# Patient Record
Sex: Female | Born: 2000 | Race: Black or African American | Hispanic: No | Marital: Single
Health system: Southern US, Community
[De-identification: ages and names within clinical notes are randomized; demographics above are authoritative.]

## PROBLEM LIST (undated history)

## (undated) DIAGNOSIS — J302 Other seasonal allergic rhinitis: Secondary | ICD-10-CM

---
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
## 2022-09-21 ENCOUNTER — Emergency Department (HOSPITAL_COMMUNITY)
Admission: EM | Admit: 2022-09-21 | Discharge: 2022-09-22 | Disposition: A | Discharge status: Home / Self Care | Payer: 59 | Attending: Emergency Medicine | Admitting: Emergency Medicine

## 2022-09-21 ENCOUNTER — Encounter (HOSPITAL_COMMUNITY): Payer: Self-pay

## 2022-09-21 ENCOUNTER — Other Ambulatory Visit: Payer: Self-pay

## 2022-09-21 DIAGNOSIS — R7401 Elevation of levels of liver transaminase levels: Secondary | ICD-10-CM | POA: Diagnosis not present

## 2022-09-21 DIAGNOSIS — R748 Abnormal levels of other serum enzymes: Secondary | ICD-10-CM | POA: Diagnosis not present

## 2022-09-21 DIAGNOSIS — R112 Nausea with vomiting, unspecified: Secondary | ICD-10-CM | POA: Insufficient documentation

## 2022-09-21 DIAGNOSIS — R1011 Right upper quadrant pain: Secondary | ICD-10-CM | POA: Diagnosis not present

## 2022-09-21 LAB — LIPASE, BLOOD: Lipase: 26 U/L (ref 11–51)

## 2022-09-21 LAB — CBC
HCT: 38.9 % (ref 36.0–46.0)
Hemoglobin: 12.4 g/dL (ref 12.0–15.0)
MCH: 27.2 pg (ref 26.0–34.0)
MCHC: 31.9 g/dL (ref 30.0–36.0)
MCV: 85.3 fL (ref 80.0–100.0)
Platelets: 189 10*3/uL (ref 150–400)
RBC: 4.56 MIL/uL (ref 3.87–5.11)
RDW: 14.2 % (ref 11.5–15.5)
WBC: 7.8 10*3/uL (ref 4.0–10.5)
nRBC: 0 % (ref 0.0–0.2)

## 2022-09-21 LAB — COMPREHENSIVE METABOLIC PANEL
ALT: 728 U/L — ABNORMAL HIGH (ref 0–44)
AST: 418 U/L — ABNORMAL HIGH (ref 15–41)
Albumin: 3.2 g/dL — ABNORMAL LOW (ref 3.5–5.0)
Alkaline Phosphatase: 152 U/L — ABNORMAL HIGH (ref 38–126)
Anion gap: 6 (ref 5–15)
BUN: 8 mg/dL (ref 6–20)
CO2: 27 mmol/L (ref 22–32)
Calcium: 8.5 mg/dL — ABNORMAL LOW (ref 8.9–10.3)
Chloride: 102 mmol/L (ref 98–111)
Creatinine, Ser: 0.78 mg/dL (ref 0.44–1.00)
GFR, Estimated: >60 mL/min (ref >60)
Glucose, Bld: 98 mg/dL (ref 70–99)
Potassium: 3.6 mmol/L (ref 3.5–5.1)
Sodium: 135 mmol/L (ref 135–145)
Total Bilirubin: 1 mg/dL (ref 0.3–1.2)
Total Protein: 6.5 g/dL (ref 6.5–8.1)

## 2022-09-21 LAB — HCG, SERUM, QUALITATIVE: Preg, Serum: NEGATIVE

## 2022-09-21 NOTE — ED Triage Notes (Signed)
Pt to ED from PCP for eval of elevated liver enzymes. Reports N/V x2 days.

## 2022-09-22 ENCOUNTER — Emergency Department (HOSPITAL_COMMUNITY): Payer: 59

## 2022-09-22 ENCOUNTER — Encounter: Payer: Self-pay | Admitting: Gastroenterology

## 2022-09-22 LAB — HEPATITIS PANEL, ACUTE
HCV Ab: NONREACTIVE
Hep A IgM: NONREACTIVE
Hep B C IgM: NONREACTIVE
Hepatitis B Surface Ag: NONREACTIVE

## 2022-09-22 LAB — ACETAMINOPHEN LEVEL: Acetaminophen (Tylenol), Serum: <10 ug/mL — ABNORMAL LOW (ref 10–30)

## 2022-09-22 MED ORDER — ONDANSETRON HCL 4 MG/2ML IJ SOLN: 4.0000 mg | Freq: Four times a day (QID) | INTRAMUSCULAR | Status: DC | PRN

## 2022-09-22 MED ORDER — IOHEXOL 350 MG/ML SOLN
75.0000 mL | Freq: Once | INTRAVENOUS | Status: AC | PRN
  Administered 2022-09-22: 75 mL via INTRAVENOUS

## 2022-09-22 MED ORDER — LACTATED RINGERS IV BOLUS
1000.0000 mL | Freq: Once | INTRAVENOUS | Status: AC
  Administered 2022-09-22: 1000 mL via INTRAVENOUS

## 2022-09-22 NOTE — Discharge Instructions (Addendum)
Other than your elevated liver enzymes, test results today do not show abnormal findings.  There is a telephone number below to call to set up a follow-up appointment with gastroenterology.  Follow-up with them as well as your PCP for ongoing surveillance of your elevated liver enzymes and possible further testing.  Return to the emergency department for any new or worsening symptoms of concern.

## 2022-09-22 NOTE — ED Notes (Signed)
Patient transported to CT scan . 

## 2022-09-22 NOTE — ED Provider Notes (Signed)
[place] EMERGENCY DEPARTMENT AT [facility] Provider Note   CSN: [id number] Arrival date & time: 09/21/22  1758     History  Chief Complaint  Patient presents with   Abnormal Lab    Betty Werner is a 22 y.o. female.   Abnormal Lab Patient presents for abnormal lab results.  Medical history includes seasonal allergies.  She was on birth control for several years without getting any periods.  She stopped taking her birth control in January.  She continued to not have periods up until last week.  She had onset of her menstrual period on Thursday.  This was the time that her abdomen started hurting.  She attributed this to her menses.  Vaginal bleeding stopped on Saturday.  Saturday, she had nausea and vomiting.  This did continue the following day.  Today, nausea has resolved.  She was seen by her PCP earlier today and underwent outpatient lab work.  Lab work was notable for elevated liver enzymes.  She was advised to come to the ED for further evaluation.  Patient currently denies abdominal pain or nausea.     Home Medications Prior to Admission medications   Medication Sig Start Date End Date Taking? Authorizing Provider  amoxicillin (AMOXIL) 500 MG capsule Take 2 capsules (1,000 mg total) by mouth 3 (three) times daily. 01/24/13   Rodolph Bong, MD  ondansetron (ZOFRAN-ODT) 4 MG disintegrating tablet Take 1 tablet (4 mg total) by mouth every 8 (eight) hours as needed for nausea or vomiting. 08/27/13   Lowanda Foster, NP      Allergies    Patient has no known allergies.    Review of Systems   Review of Systems  Gastrointestinal:  Positive for abdominal pain, nausea and vomiting.  All other systems reviewed and are negative.   Physical Exam Updated Vital Signs BP 115/78   Pulse (!) 49   Temp 98 F (36.7 C) (Temporal)   Resp 19   LMP 09/17/2022 Comment: Receives Depo injections  SpO2 100%  Physical Exam Vitals and nursing note reviewed.  Constitutional:       General: She is not in acute distress.    Appearance: Normal appearance. She is well-developed. She is not ill-appearing, toxic-appearing or diaphoretic.  HENT:     Head: Normocephalic and atraumatic.     Right Ear: External ear normal.     Left Ear: External ear normal.     Nose: Nose normal.     Mouth/Throat:     Mouth: Mucous membranes are moist.  Eyes:     Extraocular Movements: Extraocular movements intact.     Conjunctiva/sclera: Conjunctivae normal.  Cardiovascular:     Rate and Rhythm: Normal rate and regular rhythm.  Pulmonary:     Effort: Pulmonary effort is normal. No respiratory distress.  Abdominal:     General: There is no distension.     Palpations: Abdomen is soft.     Tenderness: There is no abdominal tenderness.  Musculoskeletal:        General: No swelling.     Cervical back: Normal range of motion and neck supple.  Skin:    General: Skin is warm and dry.     Coloration: Skin is not jaundiced or pale.  Neurological:     General: No focal deficit present.     Mental Status: She is alert and oriented to person, place, and time.  Psychiatric:        Mood and Affect: Mood normal.  Behavior: Behavior normal.        Thought Content: Thought content normal.        Judgment: Judgment normal.     ED Results / Procedures / Treatments   Labs (all labs ordered are listed, but only abnormal results are displayed) Labs Reviewed  COMPREHENSIVE METABOLIC PANEL - Abnormal; Notable for the following components:      Result Value   Calcium 8.5 (*)    Albumin 3.2 (*)    AST 418 (*)    ALT 728 (*)    Alkaline Phosphatase 152 (*)    All other components within normal limits  URINALYSIS, ROUTINE W REFLEX MICROSCOPIC - Abnormal; Notable for the following components:   Specific Gravity, Urine 1.004 (*)    Hgb urine dipstick MODERATE (*)    Bacteria, UA RARE (*)    All other components within normal limits  ACETAMINOPHEN LEVEL - Abnormal; Notable for the  following components:   Acetaminophen (Tylenol), Serum <10 (*)    All other components within normal limits  LIPASE, BLOOD  CBC  HCG, SERUM, QUALITATIVE  HEPATITIS PANEL, ACUTE    EKG None  Radiology CT ABDOMEN PELVIS W CONTRAST  Result Date: 09/22/2022 CLINICAL DATA:  22 year old female with history of abdominal pain. Elevated liver enzymes. EXAM: CT ABDOMEN AND PELVIS WITH CONTRAST TECHNIQUE: Multidetector CT imaging of the abdomen and pelvis was performed using the standard protocol following bolus administration of intravenous contrast. RADIATION DOSE REDUCTION: This exam was performed according to the departmental dose-optimization program which includes automated exposure control, adjustment of the mA and/or kV according to patient size and/or use of iterative reconstruction technique. CONTRAST:  75mL OMNIPAQUE IOHEXOL 350 MG/ML SOLN COMPARISON:  No priors. FINDINGS: Lower chest: Unremarkable. Hepatobiliary: No suspicious cystic or solid hepatic lesions. No intra or extrahepatic biliary ductal dilatation. Gallbladder is unremarkable in appearance. Pancreas: No pancreatic mass. No pancreatic ductal dilatation. No pancreatic or peripancreatic fluid collections or inflammatory changes. Spleen: Unremarkable. Adrenals/Urinary Tract: Bilateral kidneys and adrenal glands are normal in appearance. No hydroureteronephrosis. Urinary bladder is unremarkable in appearance. Stomach/Bowel: The appearance of the stomach is normal. No pathologic dilatation of small bowel or colon. Normal appendix. Vascular/Lymphatic: No significant atherosclerotic disease, aneurysm or dissection noted in the abdominal or pelvic vasculature. No lymphadenopathy noted in the abdomen or pelvis. Reproductive: Uterus and ovaries are unremarkable in appearance. Other: No significant volume of ascites.  No pneumoperitoneum. Musculoskeletal: There are no aggressive appearing lytic or blastic lesions noted in the visualized portions of  the skeleton. IMPRESSION: 1. No acute findings are noted in the abdomen or pelvis to account for the patient's symptoms. Electronically Signed   By: Trudie Reed M.D.   On: 09/22/2022 05:42   US Abdomen Limited RUQ (LIVER/GB)  Result Date: 09/22/2022 CLINICAL DATA:  Right upper quadrant pain. EXAM: ULTRASOUND ABDOMEN LIMITED RIGHT UPPER QUADRANT COMPARISON:  None Available. FINDINGS: Gallbladder: No gallstones or wall thickening visualized (2.1 mm). No sonographic Murphy sign noted by sonographer. Common bile duct: Diameter: 4.0 mm Liver: No focal lesion identified. Within normal limits in parenchymal echogenicity. Portal vein is patent on color Doppler imaging with normal direction of blood flow towards the liver. Other: None. IMPRESSION: Unremarkable right upper quadrant ultrasound. Electronically Signed   By: Aram Candela M.D.   On: 09/22/2022 02:41    Procedures Procedures    Medications Ordered in ED Medications  ondansetron (ZOFRAN) injection 4 mg (has no administration in time range)  lactated ringers bolus 1,000 mL (0  mLs Intravenous Stopped 09/22/22 0301)  iohexol (OMNIPAQUE) 350 MG/ML injection 75 mL (75 mLs Intravenous Contrast Given 09/22/22 0530)    ED Course/ Medical Decision Making/ A&P                                 Medical Decision Making Amount and/or Complexity of Data Reviewed Labs: ordered. Radiology: ordered.  Risk Prescription drug management.   This patient presents to the ED for concern of elevated transaminases, this involves an extensive number of treatment options, and is a complaint that carries with it a high risk of complications and morbidity.  The differential diagnosis includes viral hepatitis, autoimmune hepatitis, idiopathic appetite is, biliary obstruction, Tylenol toxicity, drug use, fatty liver   Co morbidities that complicate the patient evaluation  Seasonal allergies   Additional history obtained:  Additional history obtained from  N/A External records from outside source obtained and reviewed including EMR   Lab Tests:  I Ordered, and personally interpreted labs.  The pertinent results include: Elevations in transaminases and alkaline phosphatase.  Bilirubin remains normal.  Kidney function electrolytes are normal.  Tylenol level negative.  No leukocytosis is present.   Imaging Studies ordered:  I ordered imaging studies including right upper quadrant ultrasound I independently visualized and interpreted imaging which showed no acute findings I agree with the radiologist interpretation   Cardiac Monitoring: / EKG:  The patient was maintained on a cardiac monitor.  I personally viewed and interpreted the cardiac monitored which showed an underlying rhythm of: Sinus rhythm  Problem List / ED Course / Critical interventions / Medication management  Patient is a healthy 22 year old female presenting for findings on outpatient lab work of elevated transaminases.  She has had some recent symptoms of abdominal discomfort, nausea, and vomiting.  Today, the symptoms have resolved.  On exam, patient is well-appearing.  Her abdomen is soft and nontender.  She denies current nausea.  Given her prior fluid losses, IV fluids ordered for hydration.  Prior to being bedded in the ED, patient underwent lab work which did confirm elevated transaminases.  This is not in a cholestatic pattern, and his bilirubin is normal.  No leukocytosis is present.  She underwent a right upper quadrant ultrasound which did not show acute findings.  Hepatitis panel and Tylenol level were added onto her lab work, results of which were negative.  Patient states that she did take some Tylenol for her menstrual cramping pain but this was, at most, 500 mg twice a day.  She is not on any current daily medications that might explain her elevated transaminases.  Gastroenterology was consulted, although I did not receive call back from them.  While in the ED, she  did have some central abdominal pain.  She suspects this is from not eating all day.  She was given food.  She did undergo CT scan of abdomen pelvis which did not show any acute findings.  Patient's elevation in transaminases remains unexplained.  Her abdominal pain resolved after eating.  She continued to have resolution of her nausea.  Given her minimal symptoms, patient is appropriate for discharge and outpatient follow-up.  She was advised to follow-up with GI and PCP.  She was discharged in stable condition. I ordered medication including IV fluids for hydration Reevaluation of the patient after these medicines showed that the patient improved I have reviewed the patients home medicines and have made adjustments as needed  Final Clinical Impression(s) / ED Diagnoses Final diagnoses:  Abnormal transaminases    Rx / DC Orders ED Discharge Orders     None         Gloris Manchester, MD 09/22/22 [id number]

## 2022-09-23 ENCOUNTER — Other Ambulatory Visit (INDEPENDENT_AMBULATORY_CARE_PROVIDER_SITE_OTHER): Payer: 59

## 2022-09-23 ENCOUNTER — Ambulatory Visit: Payer: 59 | Admitting: Internal Medicine

## 2022-09-23 ENCOUNTER — Encounter: Payer: Self-pay | Admitting: Internal Medicine

## 2022-09-23 VITALS — BP 106/70 | HR 65 | Ht 64.0 in | Wt 185.8 lb

## 2022-09-23 DIAGNOSIS — R7989 Other specified abnormal findings of blood chemistry: Secondary | ICD-10-CM | POA: Diagnosis not present

## 2022-09-23 LAB — IBC + FERRITIN
Ferritin: 262.5 ng/mL (ref 10.0–291.0)
Iron: 105 ug/dL (ref 42–145)
Saturation Ratios: 30.9 % (ref 20.0–50.0)
TIBC: 340.2 ug/dL (ref 250.0–450.0)
Transferrin: 243 mg/dL (ref 212.0–360.0)

## 2022-09-23 LAB — COMPREHENSIVE METABOLIC PANEL
ALT: 494 U/L — ABNORMAL HIGH (ref 0–35)
AST: 175 U/L — ABNORMAL HIGH (ref 0–37)
Albumin: 3.5 g/dL (ref 3.5–5.2)
Alkaline Phosphatase: 150 U/L — ABNORMAL HIGH (ref 39–117)
BUN: 8 mg/dL (ref 6–23)
CO2: 29 mEq/L (ref 19–32)
Calcium: 8.7 mg/dL (ref 8.4–10.5)
Chloride: 104 mEq/L (ref 96–112)
Creatinine, Ser: 0.7 mg/dL (ref 0.40–1.20)
GFR: 124.58 mL/min (ref >60.00)
Glucose, Bld: 90 mg/dL (ref 70–99)
Potassium: 3.7 mEq/L (ref 3.5–5.1)
Sodium: 139 mEq/L (ref 135–145)
Total Bilirubin: 0.6 mg/dL (ref 0.2–1.2)
Total Protein: 6.6 g/dL (ref 6.0–8.3)

## 2022-09-23 LAB — PROTIME-INR
INR: 1 ratio (ref 0.8–1.0)
Prothrombin Time: 10.9 s (ref 9.6–13.1)

## 2022-09-23 NOTE — Progress Notes (Signed)
Chief Complaint: Elevated LFTs  HPI : 22 year old female with history of seasonal allergies presents with elevated LFTs  Her abdominal pain started last week with menstrual bleeding. The abdominal pain was located in the upper abdomen. Then vomiting started 4 days ago. She got some nausea medication when she got to the ED, which has helped. She has no longer had any vomiting episodes. The ab pain pain also stopped two days ago. Her menstrual bleeding lasted only about 3 days. She has not been on Depot since 01/2023. Endorses poor appetite. Denies dysphagia, odynophagia, or hematemesis. Denies acid reflux issues typically. Denies diarrhea, constipation, melena, hematochezia, or weight loss. Denies personal or family history of liver disease. She was taking some Tylenol for some menstrual period cramps. She drinks alcohol with about 3 shots per weekend, more over the summer. Endorses marijuana use since 2020. Urine test was negative for pregnancy a few days ago. Denies any herbal supplements. Denies fevers, swelling in legs or abdomen, or confusion.  Past Medical History:  Diagnosis Date   Seasonal allergies    No past surgical history on file. Family History  Problem Relation Age of Onset   Diabetes Mother    Diabetes Maternal Grandmother    Social History   Tobacco Use   Smoking status: Passive Smoke Exposure - Never Smoker  Substance Use Topics   Alcohol use: Yes    Alcohol/week: 3.0 standard drinks of alcohol    Types: 3 Shots of liquor per week   Drug use: Yes    Types: Marijuana   Current Outpatient Medications  Medication Sig Dispense Refill   esomeprazole (NEXIUM) 40 MG capsule Take 40 mg by mouth daily at 12 noon.     ondansetron (ZOFRAN-ODT) 4 MG disintegrating tablet Take 1 tablet (4 mg total) by mouth every 8 (eight) hours as needed for nausea or vomiting. 10 tablet 0   No current facility-administered medications for this visit.   No Known Allergies  Review of  Systems: All systems reviewed and negative except where noted in HPI.   Physical Exam: BP 106/70   Pulse 65   Ht 5\' 4"  (1.626 m)   Wt 185 lb 12.8 oz (84.3 kg)   LMP 09/17/2022 Comment: Receives Depo injections  BMI 31.89 kg/m  Constitutional: Pleasant,well-developed, female in no acute distress. HEENT: Normocephalic and atraumatic. Conjunctivae are normal. No scleral icterus. Cardiovascular: Normal rate, regular rhythm.  Pulmonary/chest: Effort normal and breath sounds normal. No wheezing, rales or rhonchi. Abdominal: Soft, nondistended, tender in the RUQ and epigastric ab pain. Bowel sounds active throughout. There are no masses palpable. No hepatomegaly. Extremities: No edema Neurological: Alert and oriented to person place and time. Skin: Skin is warm and dry. No rashes noted. Psychiatric: Normal mood and affect. Behavior is normal.  Labs 09/2022: CMP with elevated LFTs with elevated AST 418, elevated ALT 728, and elevated alk phos 152. Normal total bilirubin. CBC nml. Lipase nml. Acute hepatitis panel negative. Tylenol level negative.   RUQ U/S 09/22/22: IMPRESSION: Unremarkable right upper quadrant ultrasound.  CT A/P w/contrast 09/22/22: IMPRESSION: 1. No acute findings are noted in the abdomen or pelvis to account for the patient's symptoms.  ASSESSMENT AND PLAN: Elevated LFTs RUQ/epigastric ab pain Patient presents with elevated LFTs during an ED visit two days ago. Acute hepatitis panel was negative. RUQ U/S and CT A/P did not show any obvious sources of ab pain or sources of elevated LFTs. She does drink alcohol, but the AST:ALT ratio of  her elevated LFTs is not consistent with alcohol use. Patient denies any recent new medications or supplements. She has used some Tylenol but her Tylenol level upon arrival to the ED was normal. Due to her young age, will plan for a comprehensive work up for her elevated LFTs. Patient's symptoms are overall improving so will continue to trend  her LFTs over time to ensure that they improve. - Check CMP, PT/INR, hepatitis A antibody, hepatitis B surface antibody, ferritin/IBC, ANA, IgG, ASMA, AMA, ceruloplasmin, alpha-1 antitrypsin - Avoid alcohol use - RTC in 2 months  Eulah Pont, MD  I spent 45 minutes of time, including in depth chart review, independent review of results as outlined above, communicating results with the patient directly, face-to-face time with the patient, coordinating care, ordering studies and medications as appropriate, and documentation.

## 2022-09-23 NOTE — Patient Instructions (Addendum)
Your provider has requested that you go to the basement level for lab work before leaving today. Press "B" on the elevator. The lab is located at the first door on the left as you exit the elevator.   Avoid all alcohol use  You are schedule for a follow up visit on 11/25/22 at 9:30 am  If your blood pressure at your visit was 140/90 or greater, please contact your primary care physician to follow up on this.  _______________________________________________________  If you are age 18 or older, your body mass index should be between 23-30. Your Body mass index is 31.89 kg/m. If this is out of the aforementioned range listed, please consider follow up with your Primary Care Provider.  If you are age 95 or younger, your body mass index should be between 19-25. Your Body mass index is 31.89 kg/m. If this is out of the aformentioned range listed, please consider follow up with your Primary Care Provider.   ________________________________________________________  The [facility] providers would like to encourage you to use [facility] to communicate with providers for non-urgent requests or questions.  Due to long hold times on the telephone, sending your provider a message by [facility] may be a faster and more efficient way to get a response.  Please allow 48 business hours for a response.  Please remember that this is for non-urgent requests.  _______________________________________________________ Due to recent changes in healthcare laws, you may see the results of your imaging and laboratory studies on MyChart before your provider has had a chance to review them.  We understand that in some cases there may be results that are confusing or concerning to you. Not all laboratory results come back in the same time frame and the provider may be waiting for multiple results in order to interpret others.  Please give [place] 48 hours in order for your provider to thoroughly review all the results before contacting the  office for clarification of your results.    Thank you for entrusting me with your care and for choosing [facility],  Dr. Eulah Pont

## 2022-11-25 ENCOUNTER — Ambulatory Visit: Payer: 59 | Admitting: Internal Medicine

## 2022-12-08 ENCOUNTER — Ambulatory Visit: Payer: 59 | Admitting: Gastroenterology

## 2023-05-03 ENCOUNTER — Other Ambulatory Visit: Payer: Self-pay | Admitting: Obstetrics and Gynecology

## 2023-05-03 DIAGNOSIS — Z3009 Encounter for other general counseling and advice on contraception: Secondary | ICD-10-CM

## 2023-05-03 MED ORDER — ISIBLOOM 0.15-30 MG-MCG PO TABS
1.0000 | ORAL_TABLET | Freq: Every day | ORAL | 11 refills | Status: DC
Start: 2023-05-03 — End: 2023-07-28

## 2023-05-17 ENCOUNTER — Other Ambulatory Visit: Payer: Self-pay | Admitting: Obstetrics and Gynecology

## 2023-05-17 MED ORDER — CETIRIZINE HCL 10 MG PO TABS: 10.0000 mg | ORAL_TABLET | Freq: Every day | ORAL | 2 refills | Status: DC

## 2023-07-28 ENCOUNTER — Encounter: Payer: Self-pay | Admitting: Certified Nurse Midwife

## 2023-07-28 ENCOUNTER — Other Ambulatory Visit (HOSPITAL_COMMUNITY)
Admission: RE | Admit: 2023-07-28 | Discharge: 2023-07-28 | Disposition: A | Discharge status: Home / Self Care | Source: Ambulatory Visit | Attending: Certified Nurse Midwife | Admitting: Certified Nurse Midwife

## 2023-07-28 ENCOUNTER — Ambulatory Visit: Payer: Self-pay | Admitting: Certified Nurse Midwife

## 2023-07-28 VITALS — BP 108/72 | HR 84 | Ht 64.0 in | Wt 181.5 lb

## 2023-07-28 DIAGNOSIS — Z124 Encounter for screening for malignant neoplasm of cervix: Secondary | ICD-10-CM

## 2023-07-28 DIAGNOSIS — J302 Other seasonal allergic rhinitis: Secondary | ICD-10-CM

## 2023-07-28 DIAGNOSIS — Z113 Encounter for screening for infections with a predominantly sexual mode of transmission: Secondary | ICD-10-CM

## 2023-07-28 DIAGNOSIS — Z01419 Encounter for gynecological examination (general) (routine) without abnormal findings: Secondary | ICD-10-CM

## 2023-07-28 DIAGNOSIS — Z3009 Encounter for other general counseling and advice on contraception: Secondary | ICD-10-CM

## 2023-07-28 MED ORDER — ISIBLOOM 0.15-30 MG-MCG PO TABS
1.0000 | ORAL_TABLET | Freq: Every day | ORAL | 3 refills | Status: DC
Start: 2023-07-28 — End: 2023-09-23

## 2023-07-28 MED ORDER — CETIRIZINE HCL 10 MG PO TABS: 10.0000 mg | ORAL_TABLET | Freq: Every day | ORAL | 3 refills | Status: AC

## 2023-07-28 NOTE — Progress Notes (Signed)
 ANNUAL EXAM Patient name: Betty Werner MRN [id number]  Date of birth: February 08, 2001 Chief Complaint:   Gynecologic Exam  History of Present Illness:   Betty Werner is a 23 y.o. G0P0000 African-American female being seen today for a routine annual exam.  Current complaints: None  No LMP recorded. (Menstrual status: Oral contraceptives). (Skips periods.)   Upstream - 07/28/23 0859       Pregnancy Intention Screening   Does the patient want to become pregnant in the next year? No    Does the patient's partner want to become pregnant in the next year? No    Would the patient like to discuss contraceptive options today? N/A      Contraception Wrap Up   Current Method Oral Contraceptive    End Method Oral Contraceptive    Contraception Counseling Provided Yes    How was the end contraceptive method provided? Prescription            The pregnancy intention screening data noted above was reviewed. Potential methods of contraception were discussed. The patient elected to proceed with Oral Contraceptive.   Last pap Never (age). Results were: N/A. H/O abnormal pap: no Last mammogram: Never (age). Results were: N/A. Family h/o breast cancer: no Last colonoscopy: Never (age). Results were: N/A. Family h/o colorectal cancer: no  Review of Systems:   Pertinent items are noted in HPI Denies any headaches, blurred vision, fatigue, shortness of breath, chest pain, abdominal pain, abnormal vaginal discharge/itching/odor/irritation, problems with periods, bowel movements, urination, or intercourse unless otherwise stated above.  Pertinent History Reviewed:  Reviewed past medical,surgical, social and family history.  Reviewed problem list, medications and allergies.  Physical Assessment:   Vitals:   07/28/23 0826  BP: 108/72  Pulse: 84  Weight: 181 lb 8 oz (82.3 kg)  Height: 5' 4 (1.626 m)   Body mass index is 31.15 kg/m.   Physical Examination:  General appearance - well  appearing, and in no distress Mental status - alert, oriented to person, place, and time Psych:  She has a normal mood and affect Skin - warm and dry, normal color, no suspicious lesions noted Chest - effort normal, no problems with respiration noted Heart - normal rate and regular rhythm Neck:  midline trachea, no thyromegaly or nodules Breasts - breasts appear normal, no suspicious masses, no skin or nipple changes or  axillary nodes Abdomen - soft, nontender, nondistended, no masses or organomegaly Pelvic - VULVA: normal appearing vulva with no masses, tenderness or lesions   VAGINA: normal appearing vagina with normal color and discharge, no lesions   CERVIX: normal appearing cervix without discharge or lesions, no CMT Thin prep pap is done with HR HPV cotesting Extremities:  No swelling or varicosities noted  Chaperone present for exam  No results found for this or any previous visit (from the past 24 hours).  Assessment & Plan:  1. Seasonal allergies (Primary) - cetirizine  (ZYRTEC  ALLERGY) 10 MG tablet; Take 1 tablet (10 mg total) by mouth daily.  Dispense: 90 tablet; Refill: 3  2. Screen for STD (sexually transmitted disease) - RPR+HBsAg+HCVAb+... - Cytology - PAP( [place])  3. Papanicolaou smear for cervical cancer screening - Tolerated first speculum exam well - Cytology - PAP( [place]) - Will follow up results of pap smear and manage accordingly. - Mammogram not indicated  4. Encounter for counseling regarding contraception - Taking continuously and pharmacy would not refill early. Resent prescription with note to pharmacy, also sent for 75mo at  a time. - desogestrel-ethinyl estradiol (ISIBLOOM ) 0.15-30 MG-MCG tablet; Take 1 tablet by mouth daily.  Dispense: 84 tablet; Refill: 3  Mammogram: @ 21yo, or sooner if problems Colonoscopy: @ 21yo, or sooner if problems  Orders Placed This Encounter  Procedures   RPR+HBsAg+HCVAb+...   Meds:  Meds ordered this  encounter  Medications   cetirizine  (ZYRTEC  ALLERGY) 10 MG tablet    Sig: Take 1 tablet (10 mg total) by mouth daily.    Dispense:  90 tablet    Refill:  3   desogestrel-ethinyl estradiol (ISIBLOOM ) 0.15-30 MG-MCG tablet    Sig: Take 1 tablet by mouth daily.    Dispense:  84 tablet    Refill:  3    Taking continuously, skipping placebo pills, will need early refill   Follow-up: Return if symptoms worsen or fail to improve, for ANN.  Derick Fleeting, CNM 07/28/2023 12:33 PM

## 2023-07-29 LAB — RPR+HBSAG+HCVAB+...
HIV Screen 4th Generation wRfx: NONREACTIVE
Hep C Virus Ab: NONREACTIVE
Hepatitis B Surface Ag: NEGATIVE
RPR Ser Ql: NONREACTIVE

## 2023-08-02 LAB — CYTOLOGY - PAP
Adequacy: ABSENT
Chlamydia: NEGATIVE
Comment: NEGATIVE
Comment: NEGATIVE
Comment: NEGATIVE
Comment: NORMAL
Diagnosis: NEGATIVE
High risk HPV: NEGATIVE
Neisseria Gonorrhea: NEGATIVE
Trichomonas: NEGATIVE

## 2023-09-03 ENCOUNTER — Encounter: Payer: Self-pay | Admitting: Advanced Practice Midwife

## 2023-09-23 ENCOUNTER — Ambulatory Visit: Admitting: Obstetrics and Gynecology

## 2023-09-23 ENCOUNTER — Other Ambulatory Visit: Payer: Self-pay | Admitting: Obstetrics and Gynecology

## 2023-09-23 DIAGNOSIS — Z3009 Encounter for other general counseling and advice on contraception: Secondary | ICD-10-CM

## 2023-09-23 MED ORDER — NORELGESTROMIN-ETH ESTRADIOL 150-35 MCG/24HR TD PTWK
1.0000 | MEDICATED_PATCH | TRANSDERMAL | 12 refills | Status: AC
Start: 2023-09-23 — End: ?

## 2023-09-27 ENCOUNTER — Other Ambulatory Visit

## 2023-09-27 ENCOUNTER — Other Ambulatory Visit: Payer: Self-pay | Admitting: Obstetrics and Gynecology

## 2023-09-27 DIAGNOSIS — R748 Abnormal levels of other serum enzymes: Secondary | ICD-10-CM

## 2023-09-27 DIAGNOSIS — R112 Nausea with vomiting, unspecified: Secondary | ICD-10-CM

## 2023-09-28 ENCOUNTER — Ambulatory Visit: Payer: Self-pay | Admitting: Obstetrics and Gynecology

## 2023-09-28 LAB — COMPREHENSIVE METABOLIC PANEL WITH GFR
ALT: 16 IU/L (ref 0–32)
AST: 14 IU/L (ref 0–40)
Albumin: 4.1 g/dL (ref 4.0–5.0)
Alkaline Phosphatase: 64 IU/L (ref 44–121)
BUN/Creatinine Ratio: 11 (ref 9–23)
BUN: 8 mg/dL (ref 6–20)
Bilirubin Total: 0.3 mg/dL (ref 0.0–1.2)
CO2: 23 mmol/L (ref 20–29)
Calcium: 9.1 mg/dL (ref 8.7–10.2)
Chloride: 102 mmol/L (ref 96–106)
Creatinine, Ser: 0.71 mg/dL (ref 0.57–1.00)
Globulin, Total: 2.6 g/dL (ref 1.5–4.5)
Glucose: 75 mg/dL (ref 70–99)
Potassium: 3.8 mmol/L (ref 3.5–5.2)
Sodium: 137 mmol/L (ref 134–144)
Total Protein: 6.7 g/dL (ref 6.0–8.5)
eGFR: 124 mL/min/1.73 (ref >59)

## 2023-09-28 LAB — CBC
Hematocrit: 39.3 % (ref 34.0–46.6)
Hemoglobin: 12.2 g/dL (ref 11.1–15.9)
MCH: 27.5 pg (ref 26.6–33.0)
MCHC: 31 g/dL — ABNORMAL LOW (ref 31.5–35.7)
MCV: 89 fL (ref 79–97)
Platelets: 265 x10E3/uL (ref 150–450)
RBC: 4.43 x10E6/uL (ref 3.77–5.28)
RDW: 12.6 % (ref 11.7–15.4)
WBC: 5.5 x10E3/uL (ref 3.4–10.8)

## 2023-12-07 ENCOUNTER — Ambulatory Visit

## 2024-03-01 ENCOUNTER — Other Ambulatory Visit: Payer: Self-pay

## 2024-03-01 DIAGNOSIS — R5383 Other fatigue: Secondary | ICD-10-CM

## 2024-03-01 DIAGNOSIS — R6889 Other general symptoms and signs: Secondary | ICD-10-CM

## 2024-03-01 NOTE — Progress Notes (Signed)
 Patient reporting new feelings of fatigue and coldness. Reviewed with Cornell Finder, CNM who recommends the following labs: Anemia Profile B, Vitamin D , Thyroid  Panel with TSH. Considering changing method of birth control to IUD, will schedule with Dr. Cleatus if desired. Needs annual exam April 2026.  Vernell RN

## 2024-03-02 ENCOUNTER — Other Ambulatory Visit

## 2024-03-02 DIAGNOSIS — R5383 Other fatigue: Secondary | ICD-10-CM

## 2024-03-02 DIAGNOSIS — R6889 Other general symptoms and signs: Secondary | ICD-10-CM

## 2024-03-03 LAB — ANEMIA PROFILE B
Basophils Absolute: 0 x10E3/uL (ref 0.0–0.2)
Basos: 0 %
EOS (ABSOLUTE): 0 x10E3/uL (ref 0.0–0.4)
Eos: 0 %
Ferritin: 57 ng/mL (ref 15–150)
Folate: 12.6 ng/mL
Hematocrit: 40.2 % (ref 34.0–46.6)
Hemoglobin: 13.1 g/dL (ref 11.1–15.9)
Immature Grans (Abs): 0 x10E3/uL (ref 0.0–0.1)
Immature Granulocytes: 0 %
Iron Saturation: 34 % (ref 15–55)
Iron: 123 ug/dL (ref 27–159)
Lymphocytes Absolute: 2.5 x10E3/uL (ref 0.7–3.1)
Lymphs: 36 %
MCH: 28.6 pg (ref 26.6–33.0)
MCHC: 32.6 g/dL (ref 31.5–35.7)
MCV: 88 fL (ref 79–97)
Monocytes Absolute: 0.5 x10E3/uL (ref 0.1–0.9)
Monocytes: 7 %
Neutrophils Absolute: 3.8 x10E3/uL (ref 1.4–7.0)
Neutrophils: 57 %
Platelets: 292 x10E3/uL (ref 150–450)
RBC: 4.58 x10E6/uL (ref 3.77–5.28)
RDW: 12.7 % (ref 11.7–15.4)
Retic Ct Pct: 1.1 % (ref 0.6–2.6)
Total Iron Binding Capacity: 361 ug/dL (ref 250–450)
UIBC: 238 ug/dL (ref 131–425)
Vitamin B-12: 192 pg/mL — ABNORMAL LOW (ref 232–1245)
WBC: 6.8 x10E3/uL (ref 3.4–10.8)

## 2024-03-03 LAB — THYROID PANEL WITH TSH
Free Thyroxine Index: 2 (ref 1.2–4.9)
T3 Uptake Ratio: 17 % — ABNORMAL LOW (ref 24–39)
T4, Total: 11.7 ug/dL (ref 4.5–12.0)
TSH: 1.11 u[IU]/mL (ref 0.450–4.500)

## 2024-03-03 LAB — VITAMIN D 25 HYDROXY (VIT D DEFICIENCY, FRACTURES): Vit D, 25-Hydroxy: 9.8 ng/mL — ABNORMAL LOW (ref 30.0–100.0)

## 2024-03-08 ENCOUNTER — Ambulatory Visit: Payer: Self-pay | Admitting: Certified Nurse Midwife

## 2024-03-08 DIAGNOSIS — E559 Vitamin D deficiency, unspecified: Secondary | ICD-10-CM

## 2024-03-08 MED ORDER — VITAMIN D (ERGOCALCIFEROL) 1.25 MG (50000 UNIT) PO CAPS: 50000.0000 [IU] | ORAL_CAPSULE | ORAL | 0 refills | Status: AC
